# Patient Record
Sex: Female | Born: 1985 | Race: White | Hispanic: No | Marital: Married | Smoking: Former smoker
Health system: Southern US, Community
[De-identification: ages and names within clinical notes are randomized; demographics above are authoritative.]

## PROBLEM LIST (undated history)

## (undated) ENCOUNTER — Inpatient Hospital Stay (HOSPITAL_COMMUNITY): Payer: Self-pay

## (undated) DIAGNOSIS — Z789 Other specified health status: Secondary | ICD-10-CM

## (undated) HISTORY — PX: WISDOM TOOTH EXTRACTION: SHX21

---

## 2012-10-22 NOTE — L&D Delivery Note (Signed)
Patient was C/C/+2 and pushed for 50 minutes with epidural.   NSVD  female infant, Apgars 8,9, weight P.   The patient had one midline second degree episiotomy repaired with 2-0 vicryl R. Fundus was firm. EBL was expected. Placenta was delivered intact. Vagina was clear.  Baby was vigorous to bedside.  Saylor Sheckler A

## 2012-11-01 ENCOUNTER — Inpatient Hospital Stay (HOSPITAL_COMMUNITY)
Admission: AD | Admit: 2012-11-01 | Discharge: 2012-11-02 | Disposition: A | Discharge status: Home / Self Care | Payer: Medicaid Other | Source: Ambulatory Visit | Attending: Obstetrics & Gynecology | Admitting: Obstetrics & Gynecology

## 2012-11-01 ENCOUNTER — Encounter (HOSPITAL_COMMUNITY): Payer: Self-pay | Admitting: *Deleted

## 2012-11-01 ENCOUNTER — Inpatient Hospital Stay (HOSPITAL_COMMUNITY): Payer: Medicaid Other

## 2012-11-01 DIAGNOSIS — B9689 Other specified bacterial agents as the cause of diseases classified elsewhere: Secondary | ICD-10-CM | POA: Insufficient documentation

## 2012-11-01 DIAGNOSIS — A499 Bacterial infection, unspecified: Secondary | ICD-10-CM | POA: Insufficient documentation

## 2012-11-01 DIAGNOSIS — O26899 Other specified pregnancy related conditions, unspecified trimester: Secondary | ICD-10-CM

## 2012-11-01 DIAGNOSIS — R109 Unspecified abdominal pain: Secondary | ICD-10-CM | POA: Insufficient documentation

## 2012-11-01 DIAGNOSIS — N760 Acute vaginitis: Secondary | ICD-10-CM | POA: Insufficient documentation

## 2012-11-01 DIAGNOSIS — O2390 Unspecified genitourinary tract infection in pregnancy, unspecified trimester: Secondary | ICD-10-CM | POA: Insufficient documentation

## 2012-11-01 DIAGNOSIS — O209 Hemorrhage in early pregnancy, unspecified: Secondary | ICD-10-CM | POA: Insufficient documentation

## 2012-11-01 HISTORY — DX: Other specified health status: Z78.9

## 2012-11-01 LAB — WET PREP, GENITAL
Trich, Wet Prep: NONE SEEN
Yeast Wet Prep HPF POC: NONE SEEN

## 2012-11-01 LAB — URINALYSIS, ROUTINE W REFLEX MICROSCOPIC
Bilirubin Urine: NEGATIVE
Glucose, UA: NEGATIVE mg/dL
Hgb urine dipstick: NEGATIVE
Ketones, ur: NEGATIVE mg/dL
Specific Gravity, Urine: 1.02 (ref 1.005–1.030)
pH: 5.5 (ref 5.0–8.0)

## 2012-11-01 LAB — CBC
MCH: 31.4 pg (ref 26.0–34.0)
MCHC: 34.5 g/dL (ref 30.0–36.0)
MCV: 91.2 fL (ref 78.0–100.0)
Platelets: 262 10*3/uL (ref 150–400)

## 2012-11-01 LAB — ABO/RH: ABO/RH(D): A POS

## 2012-11-01 MED ORDER — METRONIDAZOLE 500 MG PO TABS: 500.0000 mg | ORAL_TABLET | Freq: Two times a day (BID) | ORAL | Status: DC

## 2012-11-01 NOTE — MAU Provider Note (Signed)
History     CSN: [id number]  Arrival date and time: 11/01/12 2148   First Provider Initiated Contact with Patient 11/01/12 2222      Chief Complaint  Patient presents with  . Abdominal Cramping   HPI  Reports positive pregnancy tests. Having bad cramping on R side for couple days but worse today. No bleeding.     Past Medical History  Diagnosis Date  . No pertinent past medical history     Past Surgical History  Procedure Date  . No past surgeries     Family History  Problem Relation Age of Onset  . Other Neg Hx     History  Substance Use Topics  . Smoking status: Former Games developer  . Smokeless tobacco: Not on file  . Alcohol Use: No    Allergies: No Known Allergies  Prescriptions prior to admission  Medication Sig Dispense Refill  . Prenatal Vit-Fe Fumarate-FA (PRENATAL MULTIVITAMIN) TABS Take 1 tablet by mouth daily.        Review of Systems  Constitutional: Negative for fever and chills.  Gastrointestinal: Positive for abdominal pain (right side). Negative for nausea and vomiting.  All other systems reviewed and are negative.   Physical Exam   Blood pressure 125/65, pulse 87, temperature 98.2 F (36.8 C), resp. rate 18, height 5\' 5"  (1.651 m), weight 60.056 kg (132 lb 6.4 oz), last menstrual period 09/08/2012.  Physical Exam  Constitutional: She is oriented to person, place, and time. She appears well-developed and well-nourished. No distress.  HENT:  Head: Normocephalic.  Neck: Normal range of motion. Neck supple.  Cardiovascular: Normal rate, regular rhythm and normal heart sounds.   Respiratory: Effort normal and breath sounds normal. No respiratory distress.  GI: Soft. There is tenderness (right adnexa).  Genitourinary: Uterus is enlarged. Right adnexum displays mass and tenderness. Left adnexum displays no mass, no tenderness and no fullness. No bleeding around the vagina.  Neurological: She is alert and oriented to person, place, and time.    Skin: Skin is warm and dry.    MAU Course  Procedures Ultrasound: IMPRESSION: 7-week-5-day intrauterine pregnancy with fetal heart rate 165 beats per minute. Small subchorionic hemorrhage.  Results for orders placed during the hospital encounter of 11/01/12 (from the past 24 hour(s))  URINALYSIS, ROUTINE W REFLEX MICROSCOPIC     Status: Normal   Collection Time   11/01/12 10:00 PM      Component Value Range   Color, Urine YELLOW  YELLOW   APPearance CLEAR  CLEAR   Specific Gravity, Urine 1.020  1.005 - 1.030   pH 5.5  5.0 - 8.0   Glucose, UA NEGATIVE  NEGATIVE mg/dL   Hgb urine dipstick NEGATIVE  NEGATIVE   Bilirubin Urine NEGATIVE  NEGATIVE   Ketones, ur NEGATIVE  NEGATIVE mg/dL   Protein, ur NEGATIVE  NEGATIVE mg/dL   Urobilinogen, UA 0.2  0.0 - 1.0 mg/dL   Nitrite NEGATIVE  NEGATIVE   Leukocytes, UA NEGATIVE  NEGATIVE  POCT PREGNANCY, URINE     Status: Abnormal   Collection Time   11/01/12 10:12 PM      Component Value Range   Preg Test, Ur POSITIVE (*) NEGATIVE  WET PREP, GENITAL     Status: Abnormal   Collection Time   11/01/12 10:31 PM      Component Value Range   Yeast Wet Prep HPF POC NONE SEEN  NONE SEEN   Trich, Wet Prep NONE SEEN  NONE SEEN   Clue Cells  Wet Prep HPF POC FEW (*) NONE SEEN   WBC, Wet Prep HPF POC FEW (*) NONE SEEN  HCG, QUANTITATIVE, PREGNANCY     Status: Abnormal   Collection Time   11/01/12 10:39 PM      Component Value Range   hCG, Beta Chain, Sharene Butters, [place] [address] (*) <5 mIU/mL  ABO/RH     Status: Normal (Preliminary result)   Collection Time   11/01/12 10:39 PM      Component Value Range   ABO/RH(D) A POS    CBC     Status: Abnormal   Collection Time   11/01/12 10:40 PM      Component Value Range   WBC 11.6 (*) 4.0 - 10.5 K/uL   RBC 4.07  3.87 - 5.11 MIL/uL   Hemoglobin 12.8  12.0 - 15.0 g/dL   HCT 60.4  54.0 - 98.1 %   MCV 91.2  78.0 - 100.0 fL   MCH 31.4  26.0 - 34.0 pg   MCHC 34.5  30.0 - 36.0 g/dL   RDW 19.1  47.8 - 29.5 %    Platelets 262  150 - 400 K/uL     Assessment and Plan  7.5 wk IUP Subchorionic Hemorrhage Bacterial Vaginosis  Plan: DC to home RX Flagyl Begin Prenatal Care  [facility] 11/01/2012, 10:23 PM

## 2012-11-01 NOTE — MAU Note (Signed)
I've had couple positive upt tests. Having bad cramping on R side for couple days but worse today. No bleeding

## 2012-11-02 NOTE — MAU Provider Note (Signed)
Attestation of Attending Supervision of Advanced Practitioner (PA/CNM/NP): Evaluation and management procedures were performed by the Advanced Practitioner under my supervision and collaboration.  I have reviewed the Advanced Practitioner's note and chart, and I agree with the management and plan.  Miko Sirico, MD, FACOG Attending Obstetrician & Gynecologist Faculty Practice, [facility]  

## 2012-11-02 NOTE — Progress Notes (Signed)
Written and verbal d/c instructions given and understanding voiced. Given preg verification letter, list of providers.

## 2012-11-03 LAB — GC/CHLAMYDIA PROBE AMP
CT Probe RNA: NEGATIVE
GC Probe RNA: NEGATIVE

## 2012-12-01 ENCOUNTER — Encounter (HOSPITAL_COMMUNITY): Payer: Self-pay | Admitting: *Deleted

## 2012-12-01 ENCOUNTER — Inpatient Hospital Stay (HOSPITAL_COMMUNITY)
Admission: AD | Admit: 2012-12-01 | Discharge: 2012-12-02 | Disposition: A | Discharge status: Home Health | Payer: Medicaid Other | Source: Ambulatory Visit | Attending: Obstetrics & Gynecology | Admitting: Obstetrics & Gynecology

## 2012-12-01 DIAGNOSIS — O0930 Supervision of pregnancy with insufficient antenatal care, unspecified trimester: Secondary | ICD-10-CM | POA: Insufficient documentation

## 2012-12-01 DIAGNOSIS — K3189 Other diseases of stomach and duodenum: Secondary | ICD-10-CM | POA: Insufficient documentation

## 2012-12-01 DIAGNOSIS — G4700 Insomnia, unspecified: Secondary | ICD-10-CM | POA: Insufficient documentation

## 2012-12-01 DIAGNOSIS — K5900 Constipation, unspecified: Secondary | ICD-10-CM | POA: Insufficient documentation

## 2012-12-01 DIAGNOSIS — B373 Candidiasis of vulva and vagina: Secondary | ICD-10-CM

## 2012-12-01 DIAGNOSIS — O99891 Other specified diseases and conditions complicating pregnancy: Secondary | ICD-10-CM | POA: Insufficient documentation

## 2012-12-01 DIAGNOSIS — R109 Unspecified abdominal pain: Secondary | ICD-10-CM | POA: Insufficient documentation

## 2012-12-01 NOTE — MAU Note (Signed)
PT SAYS SHE WAS HERE   IN JAN -  TO CONFIRM PREG.   SAYS SHE STARTED HAVING  LOWER ABD PAIN ON SAT-  ON/ OFF-     BECAME WORSE  TODAY- BUT PAIN IS KNOW A 3.  - SHE WANTED TO GET  CHECKED OUT  TONIGHT WHEN SHE GOT OFF WORK.     LAST SEX-   SAT  .    WAS HURTING BEFORE  SHE HAD  SEX.

## 2012-12-01 NOTE — MAU Note (Signed)
NO PNC YET- IS WAITING ON MEDICAID.

## 2012-12-02 ENCOUNTER — Encounter (HOSPITAL_COMMUNITY): Payer: Self-pay | Admitting: *Deleted

## 2012-12-02 LAB — URINALYSIS, ROUTINE W REFLEX MICROSCOPIC
Bilirubin Urine: NEGATIVE
Hgb urine dipstick: NEGATIVE
Ketones, ur: NEGATIVE mg/dL
Nitrite: NEGATIVE
Specific Gravity, Urine: <1.005 — ABNORMAL LOW (ref 1.005–1.030)
pH: 5.5 (ref 5.0–8.0)

## 2012-12-02 LAB — WET PREP, GENITAL
Clue Cells Wet Prep HPF POC: NONE SEEN
Trich, Wet Prep: NONE SEEN

## 2012-12-02 LAB — GC/CHLAMYDIA PROBE AMP
CT Probe RNA: NEGATIVE
GC Probe RNA: NEGATIVE

## 2012-12-02 MED ORDER — FLUCONAZOLE 150 MG PO TABS: 150.0000 mg | ORAL_TABLET | Freq: Once | ORAL | Status: DC

## 2012-12-02 MED ORDER — ONDANSETRON HCL 4 MG PO TABS: 4.0000 mg | ORAL_TABLET | Freq: Four times a day (QID) | ORAL | Status: DC

## 2012-12-02 MED ORDER — ZOLPIDEM TARTRATE 5 MG PO TABS: 5.0000 mg | ORAL_TABLET | Freq: Every evening | ORAL | Status: DC | PRN

## 2012-12-02 MED ORDER — FLUCONAZOLE 150 MG PO TABS
150.0000 mg | ORAL_TABLET | Freq: Every day | ORAL | Status: DC
  Administered 2012-12-02: 150 mg via ORAL
  Filled 2012-12-02: qty 1

## 2012-12-02 MED ORDER — PROMETHAZINE HCL 25 MG PO TABS: 25.0000 mg | ORAL_TABLET | Freq: Four times a day (QID) | ORAL | Status: DC | PRN

## 2012-12-02 MED ORDER — DOCUSATE SODIUM 100 MG PO CAPS: 100.0000 mg | ORAL_CAPSULE | Freq: Two times a day (BID) | ORAL | Status: DC

## 2012-12-02 NOTE — MAU Provider Note (Signed)
History     CSN: [id number]  Arrival date and time: 12/01/12 2247   First Provider Initiated Contact with Patient 12/02/12 0039      Chief Complaint  Patient presents with  . Abdominal Pain   HPI [name] is a 27 y.o. G1P0 at [redacted]w[redacted]d who presents to MAU with complaint of abdominal pain. The patient states that she has had this pain x 2 days. It has been a 9/10 at it's worst. It is 3/10 now. It is around the umbilicus and comes and goes frequently. The patient states that she has had frequent constipation where she does not have a BM x 4 days and then when she does have a BM it is often loose. She denies N/V or fever. She denies vaginal bleeding, abnormal discharge or LOF. She does not feel that these pains are contractions. She has not yet started prenatal care.   After initial assessment I returned to the room to discuss the results of the wet prep and my clinical judgment that she had a yeast infection. The patient stated at that time that she is new to the area and her family is not here and she doesn't have anyone to talk to or ask about pregnancy. She then proceeded to ask about OTC treatment recommendations for constipation, diarrhea, nausea, and insomnia. She also asked about diet recommendation during pregnancy. The patient states that she has a history of anxiety and has been very nervous about this pregnancy. She recently had a friend who miscarried around 12 weeks and she feels this has impacted her anxiety significantly. She also states that she is having trouble falling asleep most nights. She may lay in bed up to 4 hours before falling asleep. She is no longer taking any of the anxiety medications that she was previously taking.   OB History   Grav Para Term Preterm Abortions TAB SAB Ect Mult Living   1               Past Medical History  Diagnosis Date  . No pertinent past medical history     Past Surgical History  Procedure Laterality Date  . No past surgeries       Family History  Problem Relation Age of Onset  . Other Neg Hx     History  Substance Use Topics  . Smoking status: Former Games developer  . Smokeless tobacco: Not on file  . Alcohol Use: No    Allergies: No Known Allergies  Prescriptions prior to admission  Medication Sig Dispense Refill  . Prenatal Vit-Fe Fumarate-FA (PRENATAL MULTIVITAMIN) TABS Take 1 tablet by mouth daily.      . [DISCONTINUED] metroNIDAZOLE (FLAGYL) 500 MG tablet Take 1 tablet (500 mg total) by mouth 2 (two) times daily.  14 tablet  0    Review of Systems  Constitutional: Negative for fever, chills and malaise/fatigue.  Gastrointestinal: Positive for abdominal pain, diarrhea and constipation. Negative for heartburn, nausea and vomiting.  Genitourinary: Negative for dysuria, urgency and frequency.       Neg contractions Neg vaginal bleeding Neg abnormal discharge Neg LOF   Physical Exam   Blood pressure 114/68, pulse 105, temperature 97.3 F (36.3 C), temperature source Oral, resp. rate 18, height 5\' 4"  (1.626 m), weight 132 lb 6 oz (60.045 kg), last menstrual period 09/08/2012.  Physical Exam  Constitutional: She is oriented to person, place, and time. She appears well-developed and well-nourished. No distress.  HENT:  Head: Normocephalic and atraumatic.  Cardiovascular: Normal rate, regular rhythm and normal heart sounds.   Respiratory: Effort normal and breath sounds normal. No respiratory distress.  GI: Soft. Bowel sounds are normal. She exhibits no distension and no mass. There is tenderness (tenderness just below the umbilicus). There is no rebound and no guarding.  Genitourinary: Vagina normal. Uterus is enlarged. Uterus is not tender. Cervix exhibits discharge (small amount of thick white discharge noted at the cervix and caked on the walls of the vagina). Cervix exhibits no motion tenderness and no friability. Right adnexum displays no mass and no tenderness. Left adnexum displays no mass and no  tenderness.  Neurological: She is alert and oriented to person, place, and time.  Skin: Skin is warm and dry. No erythema.  Psychiatric: She has a normal mood and affect.   Results for orders placed during the hospital encounter of 12/01/12 (from the past 24 hour(s))  URINALYSIS, ROUTINE W REFLEX MICROSCOPIC     Status: Abnormal   Collection Time    12/02/12 12:05 AM      Result Value Range   Color, Urine YELLOW  YELLOW   APPearance CLEAR  CLEAR   Specific Gravity, Urine <1.005 (*) 1.005 - 1.030   pH 5.5  5.0 - 8.0   Glucose, UA NEGATIVE  NEGATIVE mg/dL   Hgb urine dipstick NEGATIVE  NEGATIVE   Bilirubin Urine NEGATIVE  NEGATIVE   Ketones, ur NEGATIVE  NEGATIVE mg/dL   Protein, ur NEGATIVE  NEGATIVE mg/dL   Urobilinogen, UA 0.2  0.0 - 1.0 mg/dL   Nitrite NEGATIVE  NEGATIVE   Leukocytes, UA NEGATIVE  NEGATIVE  WET PREP, GENITAL     Status: Abnormal   Collection Time    12/02/12 12:50 AM      Result Value Range   Yeast Wet Prep HPF POC NONE SEEN  NONE SEEN   Trich, Wet Prep NONE SEEN  NONE SEEN   Clue Cells Wet Prep HPF POC NONE SEEN  NONE SEEN   WBC, Wet Prep HPF POC FEW (*) NONE SEEN    MAU Course  Procedures None  MDM Wet prep - negative; clinical impression of yeast vaginitis With further discussion patient agrees that this pain is often present with constipation and relieved with BM Spent approximately 25 minutes on patient education and provided handouts on constipation, diarrhea, nausea, insomnia, pain, yeast infection in pregnancy  Assessment and Plan  A: Constipation Anxiety Insomnia GI upset No prenatal care  P: Discharge home Referral sent to [facility] to start prenatal care in [facility] Rx for Zofran, Phenergan, Diflucan and colace sent to patient's pharmacy Rx for Ambien given to the patient AVS contains information about fiber, BRAT diet, yeast infection and OTC medications for issues mentioned Patient may return to MAU as needed or if her condition  should change or worsen  Freddi Starr, PA-C 12/02/2012, 2:30 AM

## 2012-12-02 NOTE — MAU Note (Signed)
Pt reports abdominal pain since Saturday.

## 2012-12-04 NOTE — MAU Provider Note (Signed)
Attestation of Attending Supervision of Advanced Practitioner (CNM/NP): Evaluation and management procedures were performed by the Advanced Practitioner under my supervision and collaboration. I have reviewed the Advanced Practitioner's note and chart, and I agree with the management and plan.  Dorianne Perret H. 11:04 PM

## 2012-12-13 ENCOUNTER — Inpatient Hospital Stay (HOSPITAL_COMMUNITY): Payer: Medicaid Other

## 2012-12-13 ENCOUNTER — Inpatient Hospital Stay (HOSPITAL_COMMUNITY)
Admission: AD | Admit: 2012-12-13 | Discharge: 2012-12-13 | Disposition: A | Discharge status: Home / Self Care | Payer: Medicaid Other | Source: Ambulatory Visit | Attending: Family Medicine | Admitting: Family Medicine

## 2012-12-13 ENCOUNTER — Encounter (HOSPITAL_COMMUNITY): Payer: Self-pay | Admitting: *Deleted

## 2012-12-13 DIAGNOSIS — O2650 Maternal hypotension syndrome, unspecified trimester: Secondary | ICD-10-CM | POA: Insufficient documentation

## 2012-12-13 DIAGNOSIS — R51 Headache: Secondary | ICD-10-CM | POA: Insufficient documentation

## 2012-12-13 DIAGNOSIS — W19XXXA Unspecified fall, initial encounter: Secondary | ICD-10-CM

## 2012-12-13 DIAGNOSIS — Z3490 Encounter for supervision of normal pregnancy, unspecified, unspecified trimester: Secondary | ICD-10-CM

## 2012-12-13 DIAGNOSIS — R55 Syncope and collapse: Secondary | ICD-10-CM

## 2012-12-13 LAB — COMPREHENSIVE METABOLIC PANEL
AST: 16 U/L (ref 0–37)
Albumin: 3.5 g/dL (ref 3.5–5.2)
Alkaline Phosphatase: 40 U/L (ref 39–117)
BUN: 8 mg/dL (ref 6–23)
Chloride: 101 mEq/L (ref 96–112)
Potassium: 3.5 mEq/L (ref 3.5–5.1)
Sodium: 134 mEq/L — ABNORMAL LOW (ref 135–145)
Total Bilirubin: 0.2 mg/dL — ABNORMAL LOW (ref 0.3–1.2)
Total Protein: 6.4 g/dL (ref 6.0–8.3)

## 2012-12-13 LAB — URINALYSIS, ROUTINE W REFLEX MICROSCOPIC
Bilirubin Urine: NEGATIVE
Ketones, ur: NEGATIVE mg/dL
Leukocytes, UA: NEGATIVE
Nitrite: NEGATIVE
Protein, ur: NEGATIVE mg/dL
Urobilinogen, UA: 0.2 mg/dL (ref 0.0–1.0)
pH: 6 (ref 5.0–8.0)

## 2012-12-13 LAB — CBC WITH DIFFERENTIAL/PLATELET
Basophils Absolute: 0 10*3/uL (ref 0.0–0.1)
Basophils Relative: 0 % (ref 0–1)
Eosinophils Absolute: 0.2 10*3/uL (ref 0.0–0.7)
MCH: 30.6 pg (ref 26.0–34.0)
MCHC: 33.9 g/dL (ref 30.0–36.0)
Monocytes Relative: 7 % (ref 3–12)
Neutro Abs: 6.2 10*3/uL (ref 1.7–7.7)
Neutrophils Relative %: 66 % (ref 43–77)
Platelets: 234 10*3/uL (ref 150–400)
RDW: 12.6 % (ref 11.5–15.5)

## 2012-12-13 MED ORDER — ACETAMINOPHEN 325 MG PO TABS
650.0000 mg | ORAL_TABLET | Freq: Once | ORAL | Status: AC
  Administered 2012-12-13: 650 mg via ORAL
  Filled 2012-12-13: qty 2

## 2012-12-13 NOTE — MAU Note (Signed)
Pt was at work started to feel dizzy and passed out. Stated she hit her head on the table. Head hurts a little (like a headache). Pt was about to eat breakfast but passed out before she could. EMS  evaled and transported her to MAU. CBG at the time was 71. Pt stated dizziness has stopped.

## 2012-12-13 NOTE — MAU Provider Note (Signed)
Chart reviewed and agree with management and plan.  

## 2012-12-13 NOTE — MAU Provider Note (Signed)
History     CSN: [id number]  Arrival date and time: 12/13/12 4098   None     Chief Complaint  Patient presents with  . Loss of Consciousness   HPI Rose Clay is 27 y.o. G1P0 [redacted]w[redacted]d weeks presenting by EMS with report of a syncopal episode at work.  Got dizzy and walked over to get a bowl of fruit and next thing she knew she was on the floor and her head hurt. Episode was witnessed.   Points to left occiptal area location that hit the floor.  Denies pain but a slight headache now.  Last ate at midnight--peanut butter toast and OJ.  She had OJ before work but usually eats at work.  Denies nausea, vomiting. Cramping since fall but is unclear if she hit her stomach.   A little nausea at the time of the event but has resolved.  Denies vaginal bleeding or little discharge. Has appt in OB clinic at [facility] for 3/6.  Patient is alert, talking and does not appear to be unstable.     Past Medical History  Diagnosis Date  . No pertinent past medical history     Past Surgical History  Procedure Laterality Date  . No past surgeries      Family History  Problem Relation Age of Onset  . Other Neg Hx     History  Substance Use Topics  . Smoking status: Former Games developer  . Smokeless tobacco: Not on file  . Alcohol Use: No    Allergies: No Known Allergies  Prescriptions prior to admission  Medication Sig Dispense Refill  . Prenatal Vit-Fe Fumarate-FA (PRENATAL MULTIVITAMIN) TABS Take 1 tablet by mouth daily.        Review of Systems  Constitutional: Negative.   Gastrointestinal: Positive for abdominal pain (cramping). Negative for nausea and vomiting.  Genitourinary: Negative.        Neg for vaginal bleeding  Neurological: Positive for dizziness (prior to fall) and headaches (after fall).   Physical Exam   Blood pressure 117/59, pulse 95, temperature 98 F (36.7 C), temperature source Oral, resp. rate 18, last menstrual period 09/08/2012.  ORTHOSTATIC BLOOD PRESSURES LYING  117/58 PULSE 81; Sitting 113/72 pulse 86 and standing 117/59 pulse 95.   Physical Exam  Nursing note and vitals reviewed. Constitutional: She is oriented to person, place, and time. She appears well-developed and well-nourished. No distress.  HENT:  Head: Normocephalic.  Cardiovascular: Normal rate.   Respiratory: Effort normal.  Genitourinary:  FETAL HEART RATE BY DOPPLER 152  Neurological: She is alert and oriented to person, place, and time.  Skin: Skin is warm and dry.  Psychiatric: She has a normal mood and affect. Her behavior is normal. Judgment and thought content normal.   CBG in EMS 71 EKG: normal sinus rhythm, normal ECG.   Results for orders placed during the hospital encounter of 12/13/12 (from the past 24 hour(s))  CBC WITH DIFFERENTIAL     Status: Abnormal   Collection Time    12/13/12  8:52 AM      Result Value Range   WBC 9.4  4.0 - 10.5 K/uL   RBC 3.92  3.87 - 5.11 MIL/uL   Hemoglobin 12.0  12.0 - 15.0 g/dL   HCT 11.9 (*) 14.7 - 82.9 %   MCV 90.3  78.0 - 100.0 fL   MCH 30.6  26.0 - 34.0 pg   MCHC 33.9  30.0 - 36.0 g/dL   RDW 56.2  13.0 - 86.5 %  Platelets 234  150 - 400 K/uL   Neutrophils Relative 66  43 - 77 %   Neutro Abs 6.2  1.7 - 7.7 K/uL   Lymphocytes Relative 25  12 - 46 %   Lymphs Abs 2.4  0.7 - 4.0 K/uL   Monocytes Relative 7  3 - 12 %   Monocytes Absolute 0.6  0.1 - 1.0 K/uL   Eosinophils Relative 2  0 - 5 %   Eosinophils Absolute 0.2  0.0 - 0.7 K/uL   Basophils Relative 0  0 - 1 %   Basophils Absolute 0.0  0.0 - 0.1 K/uL  COMPREHENSIVE METABOLIC PANEL     Status: Abnormal   Collection Time    12/13/12  8:52 AM      Result Value Range   Sodium 134 (*) 135 - 145 mEq/L   Potassium 3.5  3.5 - 5.1 mEq/L   Chloride 101  96 - 112 mEq/L   CO2 23  19 - 32 mEq/L   Glucose, Bld 100 (*) 70 - 99 mg/dL   BUN 8  6 - 23 mg/dL   Creatinine, Ser 1.61  0.50 - 1.10 mg/dL   Calcium 8.9  8.4 - 09.6 mg/dL   Total Protein 6.4  6.0 - 8.3 g/dL   Albumin 3.5   3.5 - 5.2 g/dL   AST 16  0 - 37 U/L   ALT 8  0 - 35 U/L   Alkaline Phosphatase 40  39 - 117 U/L   Total Bilirubin 0.2 (*) 0.3 - 1.2 mg/dL   GFR calc non Af Amer >90  >90 mL/min   GFR calc Af Amer >90  >90 mL/min  URINALYSIS, ROUTINE W REFLEX MICROSCOPIC     Status: None   Collection Time    12/13/12  8:57 AM      Result Value Range   Color, Urine YELLOW  YELLOW   APPearance CLEAR  CLEAR   Specific Gravity, Urine 1.025  1.005 - 1.030   pH 6.0  5.0 - 8.0   Glucose, UA NEGATIVE  NEGATIVE mg/dL   Hgb urine dipstick NEGATIVE  NEGATIVE   Bilirubin Urine NEGATIVE  NEGATIVE   Ketones, ur NEGATIVE  NEGATIVE mg/dL   Protein, ur NEGATIVE  NEGATIVE mg/dL   Urobilinogen, UA 0.2  0.0 - 1.0 mg/dL   Nitrite NEGATIVE  NEGATIVE   Leukocytes, UA NEGATIVE  NEGATIVE       *RADIOLOGY REPORT*  Clinical Data: Syncopal episode. [redacted] weeks pregnant.  CT HEAD WITHOUT CONTRAST  Technique: Contiguous axial images were obtained from the base of  the skull through the vertex without contrast.  Comparison: None.  Findings: The paranasal sinuses are clear. No acute bony  abnormality. Normal appearance of intracranial structures.  Negative for acute hemorrhage, mass lesion, midline shift,  hydrocephalus or large infarct.  IMPRESSION:  Negative head CT.  Original Report Authenticated By: Richarda Overlie, M.D.     MAU Course  Procedures  MDM 9:15  Reported MSE to Dr. Idelle Leech results still pending.  Order given for CT of head without contrast 9:57  Offered Tylenol 650mg  po for headache--patient would like it. 11:05  Reported CT of head results to Dr. Shawnie Pons.  May discharge to home  Assessment and Plan  A:  Syncopal episode       Headache after fall      [redacted]w[redacted]d gestation  P:  Discharged to home     May take Tylenol prn  Keep scheduled appointment to begin prenatal care in the clinic     Return if headache worsens    Stressed importance of eating regularly and staying well hydrated  KEY,EVE  M 12/13/2012, 10:09 AM

## 2012-12-25 ENCOUNTER — Encounter: Payer: Self-pay | Admitting: Advanced Practice Midwife

## 2012-12-31 ENCOUNTER — Other Ambulatory Visit: Payer: Self-pay

## 2013-06-04 ENCOUNTER — Inpatient Hospital Stay (HOSPITAL_COMMUNITY): Payer: Medicaid Other | Admitting: Anesthesiology

## 2013-06-04 ENCOUNTER — Encounter (HOSPITAL_COMMUNITY): Payer: Self-pay | Admitting: *Deleted

## 2013-06-04 ENCOUNTER — Inpatient Hospital Stay (HOSPITAL_COMMUNITY)
Admission: AD | Admit: 2013-06-04 | Discharge: 2013-06-06 | DRG: 775 | Disposition: A | Discharge status: Home / Self Care | Payer: Medicaid Other | Source: Ambulatory Visit | Attending: Obstetrics and Gynecology | Admitting: Obstetrics and Gynecology

## 2013-06-04 ENCOUNTER — Encounter (HOSPITAL_COMMUNITY): Payer: Self-pay | Admitting: Anesthesiology

## 2013-06-04 DIAGNOSIS — Z3483 Encounter for supervision of other normal pregnancy, third trimester: Secondary | ICD-10-CM

## 2013-06-04 HISTORY — DX: Other specified health status: Z78.9

## 2013-06-04 LAB — CBC
Platelets: 228 10*3/uL (ref 150–400)
RDW: 13.2 % (ref 11.5–15.5)
WBC: 14 10*3/uL — ABNORMAL HIGH (ref 4.0–10.5)

## 2013-06-04 LAB — OB RESULTS CONSOLE GBS: GBS: NEGATIVE

## 2013-06-04 LAB — OB RESULTS CONSOLE HIV ANTIBODY (ROUTINE TESTING): HIV: NONREACTIVE

## 2013-06-04 LAB — OB RESULTS CONSOLE RPR: RPR: NONREACTIVE

## 2013-06-04 MED ORDER — FLEET ENEMA 7-19 GM/118ML RE ENEM: 1.0000 | ENEMA | RECTAL | Status: DC | PRN

## 2013-06-04 MED ORDER — PHENYLEPHRINE 40 MCG/ML (10ML) SYRINGE FOR IV PUSH (FOR BLOOD PRESSURE SUPPORT)
80.0000 ug | PREFILLED_SYRINGE | INTRAVENOUS | Status: DC | PRN
  Filled 2013-06-04: qty 2
  Filled 2013-06-04: qty 5

## 2013-06-04 MED ORDER — CITRIC ACID-SODIUM CITRATE 334-500 MG/5ML PO SOLN: 30.0000 mL | ORAL | Status: DC | PRN

## 2013-06-04 MED ORDER — IBUPROFEN 600 MG PO TABS: 600.0000 mg | ORAL_TABLET | Freq: Four times a day (QID) | ORAL | Status: DC | PRN

## 2013-06-04 MED ORDER — OXYTOCIN 40 UNITS IN LACTATED RINGERS INFUSION - SIMPLE MED
62.5000 mL/h | INTRAVENOUS | Status: DC
  Filled 2013-06-04: qty 1000

## 2013-06-04 MED ORDER — OXYTOCIN BOLUS FROM INFUSION
500.0000 mL | INTRAVENOUS | Status: DC
  Administered 2013-06-05: 500 mL via INTRAVENOUS

## 2013-06-04 MED ORDER — LACTATED RINGERS IV SOLN
INTRAVENOUS | Status: DC
  Administered 2013-06-04 (×2): via INTRAVENOUS

## 2013-06-04 MED ORDER — EPHEDRINE 5 MG/ML INJ
10.0000 mg | INTRAVENOUS | Status: DC | PRN
  Filled 2013-06-04: qty 2
  Filled 2013-06-04: qty 4

## 2013-06-04 MED ORDER — EPHEDRINE 5 MG/ML INJ
10.0000 mg | INTRAVENOUS | Status: DC | PRN
  Filled 2013-06-04: qty 2

## 2013-06-04 MED ORDER — OXYCODONE-ACETAMINOPHEN 5-325 MG PO TABS: 1.0000 | ORAL_TABLET | ORAL | Status: DC | PRN

## 2013-06-04 MED ORDER — OXYTOCIN 40 UNITS IN LACTATED RINGERS INFUSION - SIMPLE MED
1.0000 m[IU]/min | INTRAVENOUS | Status: DC
  Administered 2013-06-04: 2 m[IU]/min via INTRAVENOUS

## 2013-06-04 MED ORDER — FENTANYL 2.5 MCG/ML BUPIVACAINE 1/10 % EPIDURAL INFUSION (WH - ANES)
INTRAMUSCULAR | Status: DC | PRN
  Administered 2013-06-04: 14 mL/h via EPIDURAL

## 2013-06-04 MED ORDER — LACTATED RINGERS IV SOLN
500.0000 mL | Freq: Once | INTRAVENOUS | Status: AC
Start: 2013-06-04 — End: 2013-06-04
  Administered 2013-06-04: 1000 mL via INTRAVENOUS

## 2013-06-04 MED ORDER — PHENYLEPHRINE 40 MCG/ML (10ML) SYRINGE FOR IV PUSH (FOR BLOOD PRESSURE SUPPORT)
80.0000 ug | PREFILLED_SYRINGE | INTRAVENOUS | Status: DC | PRN
  Filled 2013-06-04: qty 2

## 2013-06-04 MED ORDER — LIDOCAINE HCL (PF) 1 % IJ SOLN
INTRAMUSCULAR | Status: DC | PRN
  Administered 2013-06-04 (×2): 4 mL

## 2013-06-04 MED ORDER — ACETAMINOPHEN 325 MG PO TABS: 650.0000 mg | ORAL_TABLET | ORAL | Status: DC | PRN

## 2013-06-04 MED ORDER — LIDOCAINE HCL (PF) 1 % IJ SOLN
30.0000 mL | INTRAMUSCULAR | Status: DC | PRN
  Filled 2013-06-04 (×2): qty 30

## 2013-06-04 MED ORDER — FENTANYL 2.5 MCG/ML BUPIVACAINE 1/10 % EPIDURAL INFUSION (WH - ANES)
14.0000 mL/h | INTRAMUSCULAR | Status: DC | PRN
  Administered 2013-06-05: 14 mL/h via EPIDURAL
  Filled 2013-06-04 (×2): qty 125

## 2013-06-04 MED ORDER — ONDANSETRON HCL 4 MG/2ML IJ SOLN: 4.0000 mg | Freq: Four times a day (QID) | INTRAMUSCULAR | Status: DC | PRN

## 2013-06-04 MED ORDER — LACTATED RINGERS IV SOLN: 500.0000 mL | INTRAVENOUS | Status: DC | PRN

## 2013-06-04 MED ORDER — DIPHENHYDRAMINE HCL 50 MG/ML IJ SOLN: 12.5000 mg | INTRAMUSCULAR | Status: DC | PRN

## 2013-06-04 MED ORDER — TERBUTALINE SULFATE 1 MG/ML IJ SOLN: 0.2500 mg | Freq: Once | INTRAMUSCULAR | Status: AC | PRN

## 2013-06-04 NOTE — H&P (Signed)
27 y.o. [redacted]w[redacted]d  G1P0000 comes in c/o SROM at 15:00.  Otherwise has good fetal movement and no bleeding.  Past Medical History  Diagnosis Date  . No pertinent past medical history   . Medical history non-contributory     Past Surgical History  Procedure Laterality Date  . Wisdom tooth extraction      OB History  Gravida Para Term Preterm AB SAB TAB Ectopic Multiple Living  1 0 0 0 0 0 0 0 0 0     # Outcome Date GA Lbr Len/2nd Weight Sex Delivery Anes PTL Lv  1 CUR               History   Social History  . Marital Status: Married    Spouse Name: N/A    Number of Children: N/A  . Years of Education: N/A   Occupational History  . Not on file.   Social History Main Topics  . Smoking status: Former Games developer  . Smokeless tobacco: Never Used  . Alcohol Use: No  . Drug Use: No  . Sexual Activity: Yes    Birth Control/ Protection: None   Other Topics Concern  . Not on file   Social History Narrative  . No narrative on file   Review of patient's allergies indicates no known allergies.    Prenatal Transfer Tool  Maternal Diabetes: No Genetic Screening: Normal Maternal Ultrasounds/Referrals: Normal Fetal Ultrasounds or other Referrals:  None Maternal Substance Abuse:  No Significant Maternal Medications:  None Significant Maternal Lab Results: None  Other ZOX:[id number].    Filed Vitals:   06/04/13 1740  BP: 145/76  Pulse: 126  Temp: 98.2 F (36.8 C)  Resp: 15     Lungs/Cor:  NAD Abdomen:  soft, gravid Ex:  no cords, erythema SVE:  1/50/-2 per Dr.Anderson FHTs:  140, good STV, NST R Toco:  q10-15   A/P   SROM. Start pitocin.  GBS neg.  Latrica Clowers A

## 2013-06-04 NOTE — Anesthesia Preprocedure Evaluation (Signed)
Anesthesia Evaluation  Patient identified by MRN, date of birth, ID band Patient awake    Reviewed: Allergy & Precautions, H&P , Patient's Chart, lab work & pertinent test results  Airway Mallampati: II TM Distance: >3 FB Neck ROM: Full    Dental no notable dental hx. (+) Teeth Intact   Pulmonary neg pulmonary ROS, former smoker,  breath sounds clear to auscultation  Pulmonary exam normal       Cardiovascular negative cardio ROS  Rhythm:Regular Rate:Normal     Neuro/Psych negative neurological ROS  negative psych ROS   GI/Hepatic negative GI ROS, Neg liver ROS,   Endo/Other    Renal/GU negative Renal ROS  negative genitourinary   Musculoskeletal negative musculoskeletal ROS (+)   Abdominal   Peds  Hematology negative hematology ROS (+)   Anesthesia Other Findings   Reproductive/Obstetrics (+) Pregnancy                           Anesthesia Physical Anesthesia Plan  ASA: II  Anesthesia Plan: Epidural   Post-op Pain Management:    Induction:   Airway Management Planned: Natural Airway  Additional Equipment:   Intra-op Plan:   Post-operative Plan:   Informed Consent: I have reviewed the patients History and Physical, chart, labs and discussed the procedure including the risks, benefits and alternatives for the proposed anesthesia with the patient or authorized representative who has indicated his/her understanding and acceptance.   Dental advisory given  Plan Discussed with: Anesthesiologist  Anesthesia Plan Comments:         Anesthesia Quick Evaluation

## 2013-06-04 NOTE — Anesthesia Procedure Notes (Signed)
Epidural Patient location during procedure: OB Start time: 06/04/2013 11:39 PM  Staffing Anesthesiologist: Sydell Prowell A. Performed by: anesthesiologist   Preanesthetic Checklist Completed: patient identified, site marked, surgical consent, pre-op evaluation, timeout performed, IV checked, risks and benefits discussed and monitors and equipment checked  Epidural Patient position: sitting Prep: site prepped and draped and DuraPrep Patient monitoring: continuous pulse ox and blood pressure Approach: midline Injection technique: LOR air  Needle:  Needle type: Tuohy  Needle gauge: 17 G Needle length: 9 cm and 9 Needle insertion depth: 5 cm cm Catheter type: closed end flexible Catheter size: 19 Gauge Catheter at skin depth: 10 cm Test dose: negative and Other  Assessment Events: blood not aspirated, injection not painful, no injection resistance, negative IV test and no paresthesia  Additional Notes Patient identified. Risks and benefits discussed including failed block, incomplete  Pain control, post dural puncture headache, nerve damage, paralysis, blood pressure Changes, nausea, vomiting, reactions to medications-both toxic and allergic and post Partum back pain. All questions were answered. Patient expressed understanding and wished to proceed. Sterile technique was used throughout procedure. Epidural site was Dressed with sterile barrier dressing. No paresthesias, signs of intravascular injection Or signs of intrathecal spread were encountered.  Patient was more comfortable after the epidural was dosed. Please see RN's note for documentation of vital signs and FHR which are stable.

## 2013-06-05 ENCOUNTER — Encounter (HOSPITAL_COMMUNITY): Payer: Self-pay | Admitting: *Deleted

## 2013-06-05 MED ORDER — TETANUS-DIPHTH-ACELL PERTUSSIS 5-2.5-18.5 LF-MCG/0.5 IM SUSP
0.5000 mL | Freq: Once | INTRAMUSCULAR | Status: DC
  Filled 2013-06-05: qty 0.5

## 2013-06-05 MED ORDER — PRENATAL MULTIVITAMIN CH
1.0000 | ORAL_TABLET | Freq: Every day | ORAL | Status: DC
  Administered 2013-06-05 – 2013-06-06 (×2): 1 via ORAL
  Filled 2013-06-05 (×2): qty 1

## 2013-06-05 MED ORDER — SODIUM CHLORIDE 0.9 % IJ SOLN: 3.0000 mL | INTRAMUSCULAR | Status: DC | PRN

## 2013-06-05 MED ORDER — SENNOSIDES-DOCUSATE SODIUM 8.6-50 MG PO TABS
2.0000 | ORAL_TABLET | Freq: Every day | ORAL | Status: DC
  Administered 2013-06-05: 2 via ORAL

## 2013-06-05 MED ORDER — BENZOCAINE-MENTHOL 20-0.5 % EX AERO
1.0000 "application " | INHALATION_SPRAY | CUTANEOUS | Status: DC | PRN
  Filled 2013-06-05: qty 56

## 2013-06-05 MED ORDER — OXYCODONE-ACETAMINOPHEN 5-325 MG PO TABS
1.0000 | ORAL_TABLET | ORAL | Status: DC | PRN
  Administered 2013-06-05 (×2): 1 via ORAL
  Administered 2013-06-05 – 2013-06-06 (×2): 2 via ORAL
  Filled 2013-06-05: qty 1
  Filled 2013-06-05 (×2): qty 2
  Filled 2013-06-05: qty 1

## 2013-06-05 MED ORDER — MEASLES, MUMPS & RUBELLA VAC ~~LOC~~ INJ
0.5000 mL | INJECTION | Freq: Once | SUBCUTANEOUS | Status: DC
  Filled 2013-06-05: qty 0.5

## 2013-06-05 MED ORDER — LANOLIN HYDROUS EX OINT: TOPICAL_OINTMENT | CUTANEOUS | Status: DC | PRN

## 2013-06-05 MED ORDER — SODIUM CHLORIDE 0.9 % IJ SOLN: 3.0000 mL | Freq: Two times a day (BID) | INTRAMUSCULAR | Status: DC

## 2013-06-05 MED ORDER — MAGNESIUM HYDROXIDE 400 MG/5ML PO SUSP: 30.0000 mL | ORAL | Status: DC | PRN

## 2013-06-05 MED ORDER — SIMETHICONE 80 MG PO CHEW: 80.0000 mg | CHEWABLE_TABLET | ORAL | Status: DC | PRN

## 2013-06-05 MED ORDER — ONDANSETRON HCL 4 MG/2ML IJ SOLN: 4.0000 mg | INTRAMUSCULAR | Status: DC | PRN

## 2013-06-05 MED ORDER — WITCH HAZEL-GLYCERIN EX PADS: 1.0000 "application " | MEDICATED_PAD | CUTANEOUS | Status: DC | PRN

## 2013-06-05 MED ORDER — FERROUS SULFATE 325 (65 FE) MG PO TABS
325.0000 mg | ORAL_TABLET | Freq: Two times a day (BID) | ORAL | Status: DC
  Administered 2013-06-05 – 2013-06-06 (×2): 325 mg via ORAL
  Filled 2013-06-05 (×2): qty 1

## 2013-06-05 MED ORDER — DIBUCAINE 1 % RE OINT: 1.0000 "application " | TOPICAL_OINTMENT | RECTAL | Status: DC | PRN

## 2013-06-05 MED ORDER — SODIUM CHLORIDE 0.9 % IV SOLN: 250.0000 mL | INTRAVENOUS | Status: DC | PRN

## 2013-06-05 MED ORDER — IBUPROFEN 800 MG PO TABS
800.0000 mg | ORAL_TABLET | Freq: Three times a day (TID) | ORAL | Status: DC
  Administered 2013-06-05 – 2013-06-06 (×4): 800 mg via ORAL
  Filled 2013-06-05 (×4): qty 1

## 2013-06-05 MED ORDER — ONDANSETRON HCL 4 MG PO TABS: 4.0000 mg | ORAL_TABLET | ORAL | Status: DC | PRN

## 2013-06-05 MED ORDER — METHYLERGONOVINE MALEATE 0.2 MG/ML IJ SOLN: 0.2000 mg | INTRAMUSCULAR | Status: DC | PRN

## 2013-06-05 MED ORDER — METHYLERGONOVINE MALEATE 0.2 MG PO TABS: 0.2000 mg | ORAL_TABLET | ORAL | Status: DC | PRN

## 2013-06-05 MED ORDER — DIPHENHYDRAMINE HCL 25 MG PO CAPS: 25.0000 mg | ORAL_CAPSULE | Freq: Four times a day (QID) | ORAL | Status: DC | PRN

## 2013-06-05 MED ORDER — ZOLPIDEM TARTRATE 5 MG PO TABS: 5.0000 mg | ORAL_TABLET | Freq: Every evening | ORAL | Status: DC | PRN

## 2013-06-05 NOTE — Anesthesia Postprocedure Evaluation (Signed)
Anesthesia Post Note  Patient: @Rose Clay @[facility]   Procedure(s) Performed: CLE/C/S  Anesthesia type: Epidural  Patient location: Mother/Baby  Post pain: Pain level controlled  Post assessment: Post-op Vital signs reviewed  Last Vitals: BP 110/64  Pulse 85  Temp(Src) 36.9 C (Oral)  Resp 20  Ht 5\' 5"  (1.651 m)  Wt 158 lb (71.668 kg)  BMI 26.29 kg/m2  SpO2 96%  LMP 09/08/2012  Post vital signs: Reviewed  Level of consciousness: awake  Complications: No apparent anesthesia complications

## 2013-06-05 NOTE — Progress Notes (Signed)
UR chart review completed.  

## 2013-06-05 NOTE — Progress Notes (Signed)
Already charted at 0830

## 2013-06-06 LAB — CBC
HCT: 26.9 % — ABNORMAL LOW (ref 36.0–46.0)
Hemoglobin: 8.8 g/dL — ABNORMAL LOW (ref 12.0–15.0)
MCV: 86.5 fL (ref 78.0–100.0)
RBC: 3.11 MIL/uL — ABNORMAL LOW (ref 3.87–5.11)
WBC: 14.1 10*3/uL — ABNORMAL HIGH (ref 4.0–10.5)

## 2013-06-06 MED ORDER — OXYCODONE-ACETAMINOPHEN 5-325 MG PO TABS: 1.0000 | ORAL_TABLET | ORAL | Status: AC | PRN

## 2013-06-06 MED ORDER — FERROUS SULFATE 325 (65 FE) MG PO TABS: 325.0000 mg | ORAL_TABLET | Freq: Two times a day (BID) | ORAL | Status: AC

## 2013-06-06 NOTE — Discharge Summary (Signed)
Obstetric Discharge Summary Reason for Admission: rupture of membranes Prenatal Procedures: ultrasound Intrapartum Procedures: spontaneous vaginal delivery Postpartum Procedures: none Complications-Operative and Postpartum: 2nd degree perineal laceration Hemoglobin  Date Value Range Status  06/06/2013 8.8* 12.0 - 15.0 g/dL Final     REPEATED TO VERIFY     DELTA CHECK NOTED     HCT  Date Value Range Status  06/06/2013 26.9* 36.0 - 46.0 % Final    Physical Exam:  General: alert Lochia: appropriate Uterine Fundus: firm   Discharge Diagnoses: Term Pregnancy-delivered  Discharge Information: Date: 06/06/2013 Activity: pelvic rest Diet: routine Medications: PNV, Iron and Percocet Condition: stable Instructions: refer to practice specific booklet Discharge to: home Follow-up Information   Follow up with HORVATH,MICHELLE A, MD. Schedule an appointment as soon as possible for a visit in 4 weeks.   Specialty:  Obstetrics and Gynecology   Contact information:   [address] [place] [address] [phone]       Newborn Data: Live born female  Birth Weight: 6 lb 10.4 oz (3015 g) APGAR: 8, 9  Home with mother.  Sehaj Mcenroe E 06/06/2013, 10:06 AM

## 2013-06-06 NOTE — Progress Notes (Signed)
PPD#1 Pt without complaints. Lochia-mod. Breastfeeding going well. VSSAF Hgb - 8.8 IMP/ doing well PLAN/ will discharge.

## 2014-08-18 IMAGING — US US OB COMP LESS 14 WK
1 series · 14 of 22 positions shown · non-contrast
Comparison: None.

CLINICAL DATA: Vaginal Bleeding/pain,

OBSTETRIC <14 WK US
TECHNIQUE: Transabdominal ultrasound examination was performed for
complete evaluation of the gestation as well as the maternal
uterus, adnexal regions, and pelvic cul-de-sac.

[Series 1: us ob comp less 14 wks · 14 of 22 slices shown]
[im 1/22]
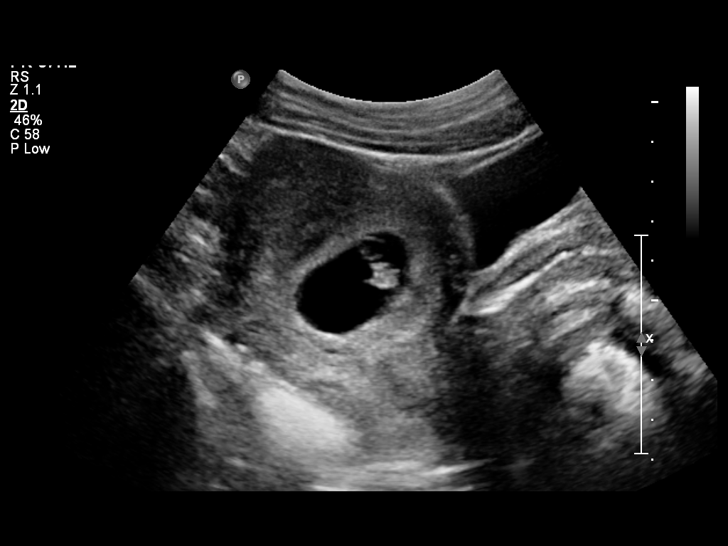
[im 3/22]
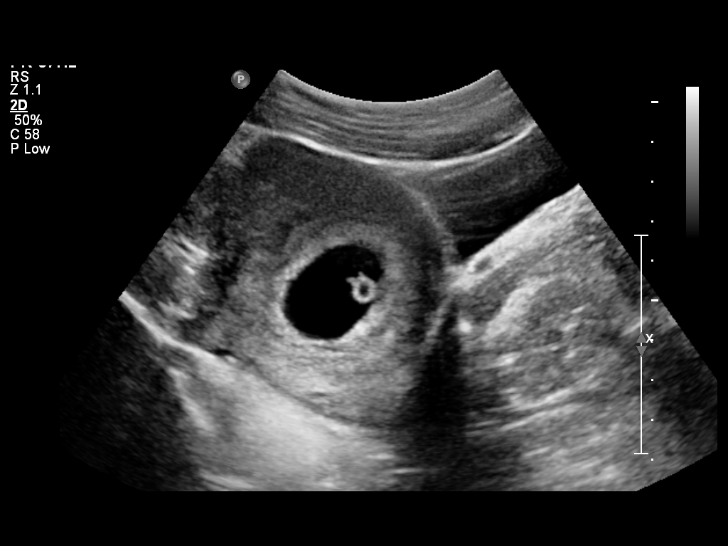
[im 4/22]
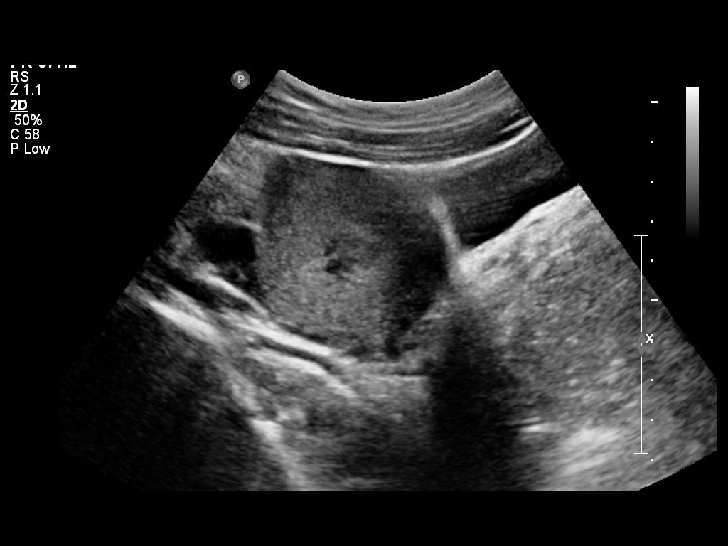
[im 6/22]
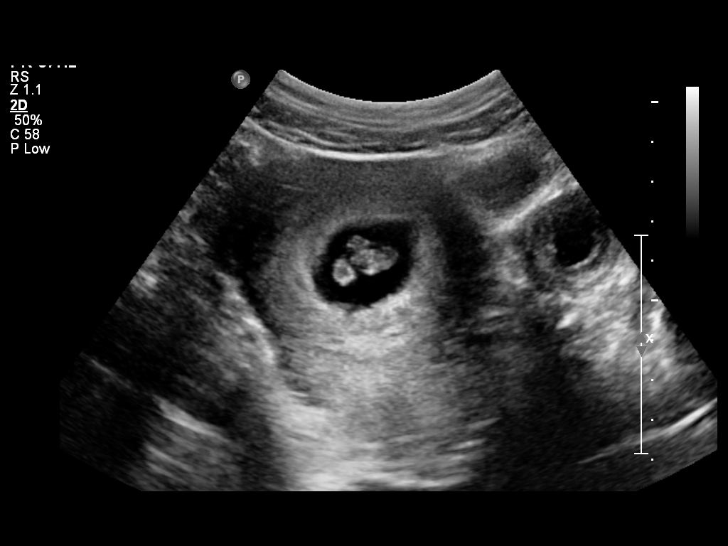
[im 8/22]
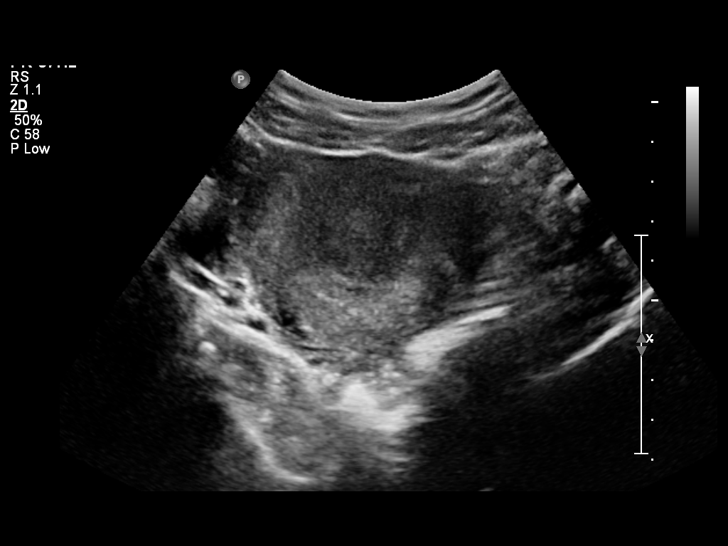
[im 9/22]
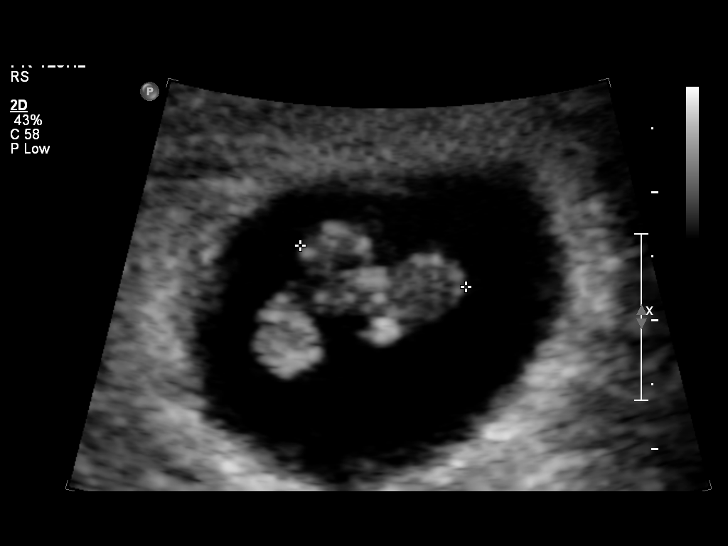
[im 11/22]
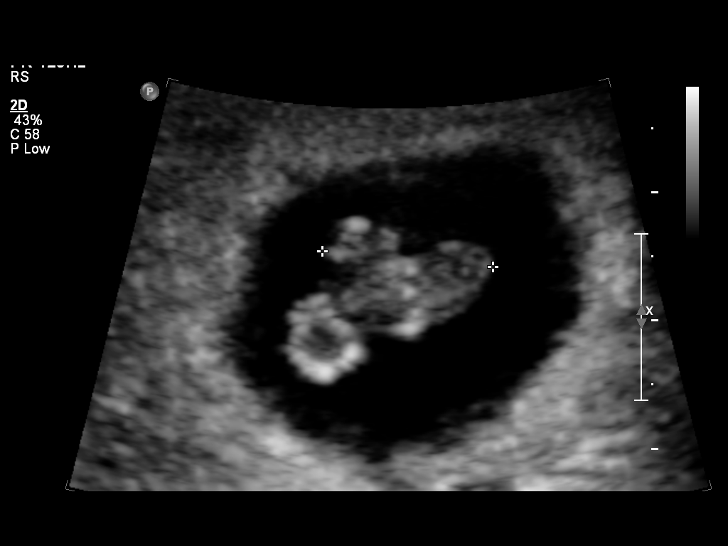
[im 12/22]
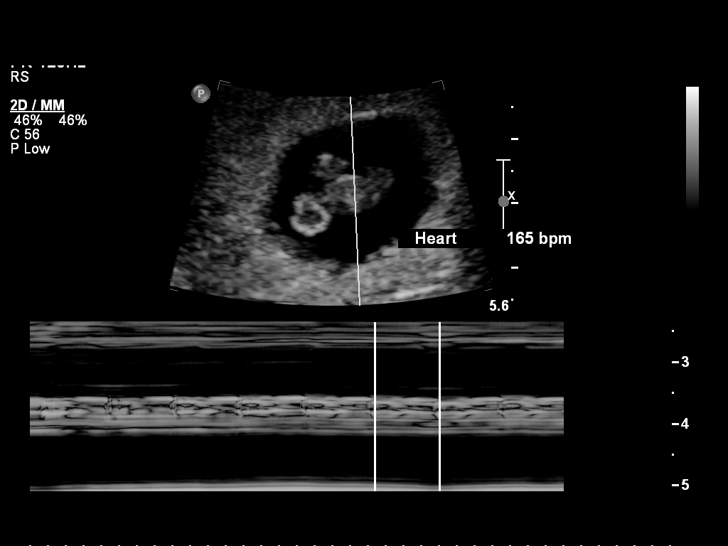
[im 14/22]
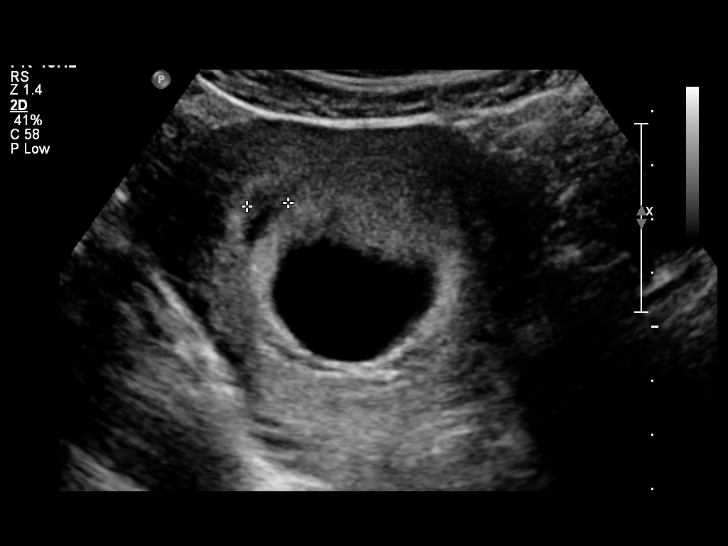
[im 15/22]
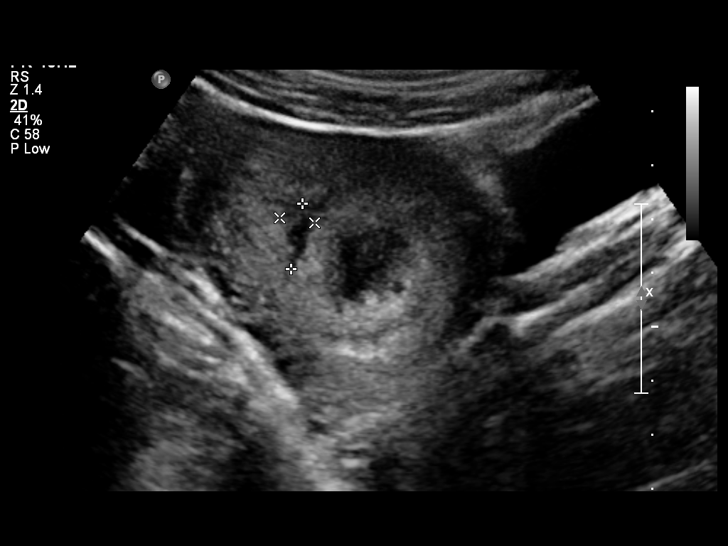
[im 17/22]
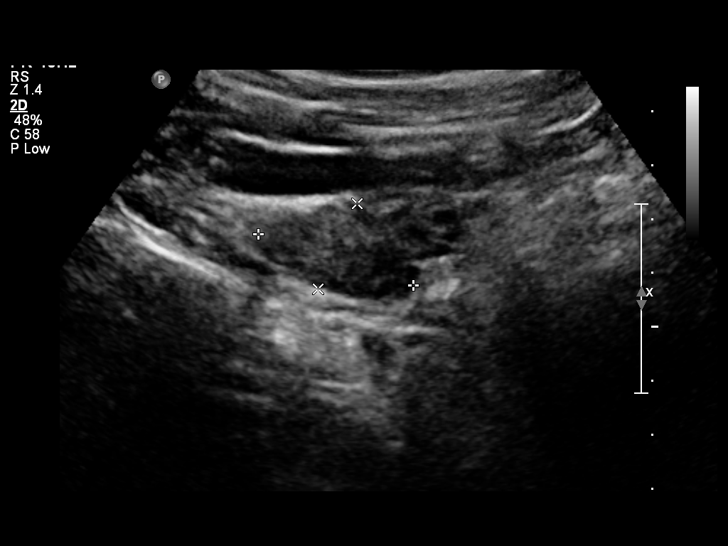
[im 19/22]
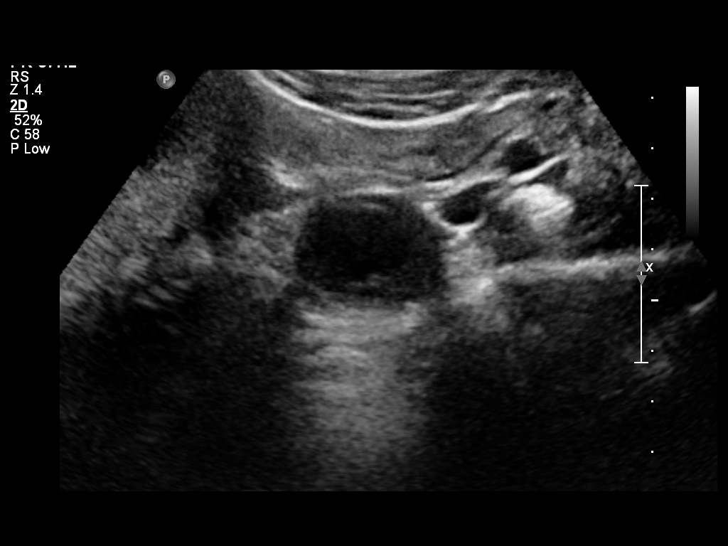
[im 20/22]
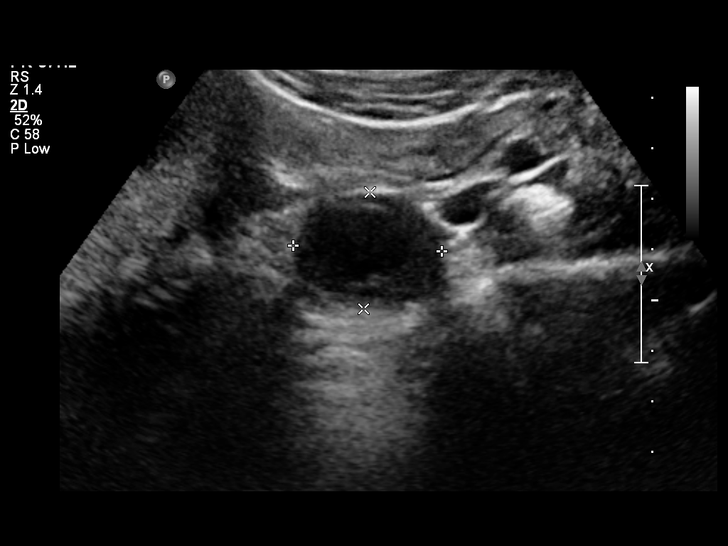
[im 22/22]
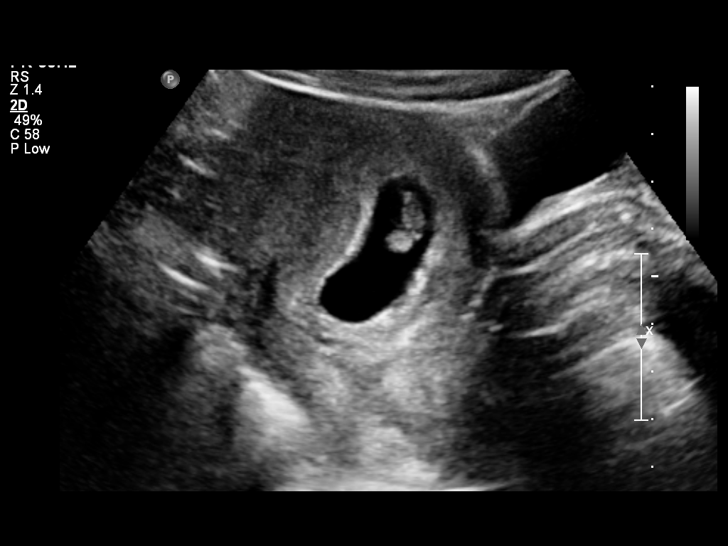

[14 of 22 positions shown; findings below may reference images not displayed]

FINDINGS: Single intrauterine gestation. Crown-rump length is
mm for an estimated gestational age of 7 weeks 5 days. Small
subchorionic hemorrhage.  Fetal heart rate 165 beats per minute.

Ovaries are symmetric in size and echotexture.  No adnexal masses.
Left corpus luteum cyst.  No free fluid.
IMPRESSION: 7-week-7-day intrauterine pregnancy with fetal heart rate 165 beats
per minute.  Small subchorionic hemorrhage.

## 2014-08-23 ENCOUNTER — Encounter (HOSPITAL_COMMUNITY): Payer: Self-pay | Admitting: *Deleted

## 2019-01-18 NOTE — Congregational Nurse Program (Signed)
No complaints or concerns. Jenene Slicker RN, [facility], [phone]
